# Patient Record
Sex: Male | Born: 1965 | Race: White | Hispanic: No | Marital: Single | Smoking: Never smoker
Health system: Southern US, Community
[De-identification: ages and names within clinical notes are randomized; demographics above are authoritative.]

## PROBLEM LIST (undated history)

## (undated) DIAGNOSIS — N19 Unspecified kidney failure: Secondary | ICD-10-CM

## (undated) HISTORY — PX: LEG SURGERY: SHX1003

## (undated) HISTORY — PX: OTHER SURGICAL HISTORY: SHX169

---

## 2017-12-25 ENCOUNTER — Other Ambulatory Visit: Payer: Self-pay

## 2017-12-25 ENCOUNTER — Encounter (HOSPITAL_BASED_OUTPATIENT_CLINIC_OR_DEPARTMENT_OTHER): Payer: Self-pay

## 2017-12-25 ENCOUNTER — Emergency Department (HOSPITAL_BASED_OUTPATIENT_CLINIC_OR_DEPARTMENT_OTHER)
Admission: EM | Admit: 2017-12-25 | Discharge: 2017-12-25 | Disposition: A | Discharge status: Home / Self Care | Payer: Self-pay | Attending: Emergency Medicine | Admitting: Emergency Medicine

## 2017-12-25 ENCOUNTER — Emergency Department (HOSPITAL_BASED_OUTPATIENT_CLINIC_OR_DEPARTMENT_OTHER): Payer: Self-pay

## 2017-12-25 DIAGNOSIS — K429 Umbilical hernia without obstruction or gangrene: Secondary | ICD-10-CM | POA: Insufficient documentation

## 2017-12-25 HISTORY — DX: Unspecified kidney failure: N19

## 2017-12-25 LAB — CBC WITH DIFFERENTIAL/PLATELET
Abs Immature Granulocytes: 0.03 10*3/uL (ref 0.00–0.07)
Basophils Absolute: 0.1 10*3/uL (ref 0.0–0.1)
Basophils Relative: 1 %
EOS PCT: 3 %
Eosinophils Absolute: 0.2 10*3/uL (ref 0.0–0.5)
HCT: 43.4 % (ref 39.0–52.0)
Hemoglobin: 13.9 g/dL (ref 13.0–17.0)
Immature Granulocytes: 0 %
Lymphocytes Relative: 22 %
Lymphs Abs: 1.7 10*3/uL (ref 0.7–4.0)
MCH: 27.9 pg (ref 26.0–34.0)
MCHC: 32 g/dL (ref 30.0–36.0)
MCV: 87.1 fL (ref 80.0–100.0)
MONOS PCT: 8 %
Monocytes Absolute: 0.6 10*3/uL (ref 0.1–1.0)
Neutro Abs: 5 10*3/uL (ref 1.7–7.7)
Neutrophils Relative %: 66 %
Platelets: 262 10*3/uL (ref 150–400)
RBC: 4.98 MIL/uL (ref 4.22–5.81)
RDW: 12.6 % (ref 11.5–15.5)
WBC: 7.6 10*3/uL (ref 4.0–10.5)
nRBC: 0 % (ref 0.0–0.2)

## 2017-12-25 LAB — COMPREHENSIVE METABOLIC PANEL
ALT: 26 U/L (ref 0–44)
AST: 28 U/L (ref 15–41)
Albumin: 4.1 g/dL (ref 3.5–5.0)
Alkaline Phosphatase: 83 U/L (ref 38–126)
Anion gap: 6 (ref 5–15)
BILIRUBIN TOTAL: 0.2 mg/dL — AB (ref 0.3–1.2)
BUN: 12 mg/dL (ref 6–20)
CO2: 27 mmol/L (ref 22–32)
CREATININE: 0.91 mg/dL (ref 0.61–1.24)
Calcium: 9 mg/dL (ref 8.9–10.3)
Chloride: 104 mmol/L (ref 98–111)
GFR calc Af Amer: >60 mL/min (ref >60)
GFR calc non Af Amer: >60 mL/min (ref >60)
Glucose, Bld: 78 mg/dL (ref 70–99)
Potassium: 3.9 mmol/L (ref 3.5–5.1)
Sodium: 137 mmol/L (ref 135–145)
TOTAL PROTEIN: 7.3 g/dL (ref 6.5–8.1)

## 2017-12-25 LAB — URINALYSIS, ROUTINE W REFLEX MICROSCOPIC
Bilirubin Urine: NEGATIVE
GLUCOSE, UA: NEGATIVE mg/dL
Hgb urine dipstick: NEGATIVE
Ketones, ur: NEGATIVE mg/dL
Leukocytes, UA: NEGATIVE
Nitrite: NEGATIVE
PROTEIN: NEGATIVE mg/dL
Specific Gravity, Urine: 1.02 (ref 1.005–1.030)
pH: 5.5 (ref 5.0–8.0)

## 2017-12-25 LAB — LIPASE, BLOOD: Lipase: 32 U/L (ref 11–51)

## 2017-12-25 MED ORDER — HYDROCODONE-ACETAMINOPHEN 5-325 MG PO TABS: 1.0000 | ORAL_TABLET | Freq: Four times a day (QID) | ORAL | 0 refills | Status: AC | PRN

## 2017-12-25 MED ORDER — IOPAMIDOL (ISOVUE-300) INJECTION 61%
100.0000 mL | Freq: Once | INTRAVENOUS | Status: AC | PRN
  Administered 2017-12-25: 100 mL via INTRAVENOUS

## 2017-12-25 MED ORDER — SODIUM CHLORIDE 0.9 % IV BOLUS
500.0000 mL | Freq: Once | INTRAVENOUS | Status: AC
  Administered 2017-12-25: 500 mL via INTRAVENOUS

## 2017-12-25 NOTE — Discharge Instructions (Addendum)
You have been diagnosed today with umbilical hernia.  At this time there does not appear to be the presence of an emergent medical condition, however there is always the potential for conditions to change. Please read and follow the below instructions.  Please return to the Emergency Department immediately for any new or worsening symptoms. Please be sure to follow up with your Primary Care Provider next week regarding your visit today; please call their office to schedule an appointment even if you are feeling better for a follow-up visit. Please call [facility], Dr. Ardine EngGerkin's office tomorrow morning to schedule an appointment for this week.  You will need to be scheduled for surgery for your umbilical hernia. Additionally your CT scan showed many incidental findings today be sure to discuss this with your primary care provider at your visit this week.  These include: Diverticulosis, fatty liver, aortic atherosclerosis, L5 pars defect with anterior slippage and disc space narrowing, esophageal thickening/distention, enlarged prostate, 4 mm right middle lobe nodule, 4 mm left lower lobe nodule, 2 mm posterior left lower lobe nodule.  Please be sure to review the entirety your scan today with your primary care provider as further evaluation, treatment and imaging may be necessary. You may find your results on your MyChart account. In relation to your hernia today, please use ice packs to help with your pain.  Avoid lifting or exercise as to not worsen your hernia.  You may use the pain medication Norco as prescribed for severe pain breakthroughs.  Do not drive or operate heavy machinery while taking Norco.  Get help right away if: You develop sudden, severe pain near the area of your hernia. You have pain as well as nausea or vomiting. You have pain and the skin over your hernia changes color. You develop a fever. You cannot pass gas You stop having bowel movements  Please read the  additional information packets attached to your discharge summary.  Do not take your medicine if  develop an itchy rash, swelling in your mouth or lips, or difficulty breathing.

## 2017-12-25 NOTE — ED Provider Notes (Signed)
[facility] EMERGENCY DEPARTMENT Provider Note   CSN: [id number] Arrival date & time: 12/25/17  1241     History   Chief Complaint Chief Complaint  Patient presents with  . Abdominal Pain    HPI Donald Green is a 52 y.o. male presented today for abdominal pain.  Patient states that he was diagnosed with umbilical hernia over a decade ago by his primary care provider and that this is not caused him pain since that time.  Patient states that 6 days ago he developed increased swelling to the area accompanied with pain.  Patient states that he has a moderate in intensity aching pain to his umbilicus and suprapubic area that is worsened with movement, eating and improved with rest.  Patient states the pain is constantly there however waxes and wanes.  Additionally patient states that his abdomen has been more distended over the past 6 days.  Patient denies nausea/vomiting, states his last bowel movement was this morning and was normal, states that he still passing gas.  Patient states that he has been otherwise feeling well and is without additional complaint.  HPI  Past Medical History:  Diagnosis Date  . Kidney failure     There are no active problems to display for this patient.   Past Surgical History:  Procedure Laterality Date  . arm surgery    . LEG SURGERY          Home Medications    Prior to Admission medications   Medication Sig Start Date End Date Taking? Authorizing Provider  HYDROcodone-acetaminophen (NORCO/VICODIN) 5-325 MG tablet Take 1-2 tablets by mouth every 6 (six) hours as needed. 12/25/17   Bill Salinas, PA-C    Family History No family history on file.  Social History Social History   Tobacco Use  . Smoking status: Never Smoker  . Smokeless tobacco: Never Used  Substance Use Topics  . Alcohol use: Yes    Comment: occ  . Drug use: Never     Allergies   Vancomycin   Review of Systems Review of Systems    Constitutional: Negative.  Negative for chills and fever.  HENT: Negative.  Negative for rhinorrhea and sore throat.   Eyes: Negative.  Negative for visual disturbance.  Respiratory: Negative.  Negative for cough and shortness of breath.   Cardiovascular: Negative.  Negative for chest pain.  Gastrointestinal: Positive for abdominal distention and abdominal pain. Negative for diarrhea, nausea and vomiting.  Genitourinary: Negative.  Negative for dysuria and hematuria.  Musculoskeletal: Negative.  Negative for arthralgias and myalgias.  Skin: Negative.  Negative for rash.  Neurological: Negative.  Negative for dizziness, weakness and headaches.   Physical Exam Updated Vital Signs BP 131/69   Pulse 61   Temp 98 F (36.7 C) (Oral)   Resp 20   Ht 6' (1.829 m)   Wt 124.7 kg   SpO2 99%   BMI 37.30 kg/m   Physical Exam  Constitutional: He appears well-developed and well-nourished. No distress.  HENT:  Head: Normocephalic and atraumatic.  Right Ear: External ear normal.  Left Ear: External ear normal.  Nose: Nose normal.  Eyes: Pupils are equal, round, and reactive to light. EOM are normal.  Neck: Trachea normal, normal range of motion, full passive range of motion without pain and phonation normal. Neck supple. No tracheal deviation present.  Cardiovascular: Normal rate, regular rhythm and normal heart sounds.  Pulses:      Dorsalis pedis pulses are 2+ on the right  side, and 2+ on the left side.       Posterior tibial pulses are 2+ on the right side, and 2+ on the left side.  Pulmonary/Chest: Effort normal and breath sounds normal. No respiratory distress. He exhibits no tenderness, no crepitus and no deformity.  Abdominal: Soft. He exhibits distension. There is tenderness in the periumbilical area and suprapubic area. There is no rigidity, no rebound, no guarding and no CVA tenderness. A hernia is present.    Patient with nonreducible umbilical hernia.  Genitourinary:   Genitourinary Comments: Deferred by patient  Musculoskeletal: Normal range of motion.  Patient moving all extremity spontaneously and without distress.   Left lower leg swelling greater than right, patient and his wife states that this is a chronic issue due to trauma and surgery to the left lower leg.  Feet:  Right Foot:  Protective Sensation: 3 sites tested. 3 sites sensed.  Left Foot:  Protective Sensation: 3 sites tested. 3 sites sensed.  Neurological: He is alert. GCS eye subscore is 4. GCS verbal subscore is 5. GCS motor subscore is 6.  Speech is clear and goal oriented, follows commands Major Cranial nerves without deficit, no facial droop Normal strength in upper and lower extremities bilaterally including dorsiflexion and plantar flexion, strong and equal grip strength Sensation normal to light touch Moves extremities without ataxia, coordination intact Normal gait  Skin: Skin is warm and dry.  Psychiatric: He has a normal mood and affect. His behavior is normal.   ED Treatments / Results  Labs (all labs ordered are listed, but only abnormal results are displayed) Labs Reviewed  COMPREHENSIVE METABOLIC PANEL - Abnormal; Notable for the following components:      Result Value   Total Bilirubin 0.2 (*)    All other components within normal limits  CBC WITH DIFFERENTIAL/PLATELET  LIPASE, BLOOD  URINALYSIS, ROUTINE W REFLEX MICROSCOPIC    EKG None  Radiology Ct Abdomen Pelvis W Contrast  Result Date: 12/25/2017 CLINICAL DATA:  52 year old male with umbilical pain for the past week. Bulge in area for the past 10 years. Initial encounter. EXAM: CT ABDOMEN AND PELVIS WITH CONTRAST TECHNIQUE: Multidetector CT imaging of the abdomen and pelvis was performed using the standard protocol following bolus administration of intravenous contrast. CONTRAST:  100mL ISOVUE-300 IOPAMIDOL (ISOVUE-300) INJECTION 61% COMPARISON:  None. FINDINGS: Lower chest: 4 mm nodule right middle lobe  (series 4, image 4) left lower lobe 4 mm nodule (series 4, image 16). Posterior left lower lobe 2 mm nodule (series 6, image 116). Scattered areas of subsegmental atelectasis/scarring. Heart size within normal limits. Hepatobiliary: Enlarged fatty liver spanning over 20 cm. Partially contracted gallbladder without calcified gallstones or CT evidence of inflammation. Pancreas: No worrisome pancreatic mass or inflammation. Spleen: No splenic mass or enlargement. Adrenals/Urinary Tract: No obstructing stone or hydronephrosis. No worrisome renal or adrenal lesion. Noncontrast filled views of the urinary bladder unremarkable. Stomach/Bowel: Diverticulosis most notable sigmoid colon with there is associated muscular hypertrophy. No primary bowel inflammatory process identified. Incomplete distension distal esophagus with mild circumferential thickening. Evaluation limited. No gastric abnormality identified. Vascular/Lymphatic: Trace calcified plaque aorta and femoral arteries. No abdominal aortic aneurysm or large vessel occlusion. Replaced hepatic artery. Scattered normal size lymph nodes. Reproductive: Minimally prominent size prostate gland. Other: Fat and vessel containing periumbilical hernia (along superior margin of the umbilicus). Rent measures up to 1.2 cm with hernia contents measuring up to 3.5 cm potentially containing fluid. Hernia contents appear inflamed with infiltration of surrounding fat planes.  This inflammation extends to the anterior margin of adjacent bowel however, bowel does not enter the hernia. Musculoskeletal: L5 bilateral pars defects with 3 mm anterior slip L5 and marked L5-S1 disc space narrowing. This contributes to bilateral L5-S1 foraminal narrowing and encroachment upon the exiting L5 nerve roots. Mild L4-5 disc space narrowing. Mild left hip joint degenerative changes. IMPRESSION: 1. Fat and vessel containing periumbilical hernia (along superior margin of the umbilicus). Rent measures up  to 1.2 cm with hernia contents measuring up to 3.5 cm potentially containing fluid. Hernia contents appear inflamed with infiltration of surrounding fat planes. This inflammation extends to the anterior margin of adjacent bowel however, bowel does not enter the hernia. 2. Sigmoid colon and descending colon diverticulosis with muscular hypertrophy most notable sigmoid colon level. 3. Enlarged fatty liver spanning over 20 cm. 4. Trace calcified plaque aorta and femoral arteries. 5. L5 bilateral pars defects with 3 mm anterior slip L5 and marked L5-S1 disc space narrowing. This contributes to bilateral L5-S1 foraminal narrowing and encroachment upon the exiting L5 nerve roots. 6. Incomplete distension distal esophagus with mild circumferential thickening. Evaluation limited. 7. Minimally prominent size prostate gland. 8. 4 mm nodule right middle lobe (series 4, image 4) left lower lobe 4 mm nodule (series 4, image 16). Posterior left lower lobe 2 mm nodule (series 6, image 116). No follow-up needed if patient is low-risk. Non-contrast chest CT can be considered in 12 months if patient is high-risk. This recommendation follows the consensus statement: Guidelines for Management of Incidental Pulmonary Nodules Detected on CT Images: From the Fleischner Society 2017; Radiology 2017; 284:228-243. Electronically Signed   By: Lacy Duverney M.D.   On: 12/25/2017 16:51    Procedures Procedures (including critical care time)  Medications Ordered in ED Medications  sodium chloride 0.9 % bolus 500 mL ( Intravenous Stopped 12/25/17 1608)  iopamidol (ISOVUE-300) 61 % injection 100 mL (100 mLs Intravenous Contrast Given 12/25/17 1610)     Initial Impression / Assessment and Plan / ED Course  I have reviewed the triage vital signs and the nursing notes.  Pertinent labs & imaging results that were available during my care of the patient were reviewed by me and considered in my medical decision making (see chart for  details).    52 year old male presenting today for 1 week of periumbilical and suprapubic abdominal pain with umbilical hernia present. ---------------------------------------------------------------------------- 5:00PM: CBC within normal limits, lipase within normal limits, urinalysis within normal limits, CMP nonacute. CT abdomen pelvis with multiple findings, discussed with Dr. Rush Landmark.  Consult to general surgery called for inflamed fat/vessel containing hernia.  Remainder of CT findings to be followed up with by primary care provider. ------------------------------------------------------------------------------ 5:24 PM: Consult called to general surgery, Dr. Gerrit Friends, advises symptomatic control, ice packs and follow-up in his clinic this week to schedule surgery. ----------------------------------------------------------------------------- I have discussed CT imaging results with the patient at length today, I have also printed off the CT report for the patient to bring to his primary care provider for follow-up on his multiple incidental findings today.  Patient was informed to call Dr. Ardine Eng office tomorrow morning to schedule an appointment.  Advised on recommendations of ice packs and lifting restrictions.  Patient given small course of Norco for severe pain breakthroughs, informed not to drive or operate heavy machinery while taking these.  At discharge patient is afebrile, not tachycardic, not hypotensive, well-appearing and in no acute distress.  Patient does not meet SIRS/sepsis criteria.  Patient has not required/requested pain  medication during his visit today.  Patient's fianc is here to drive him home.  At this time there does not appear to be any evidence of an acute emergency medical condition and the patient appears stable for discharge with appropriate outpatient follow up. Diagnosis was discussed with patient who verbalizes understanding of care plan and is agreeable to  discharge. I have discussed return precautions with patient who verbalizes understanding of return precautions. Patient strongly encouraged to follow-up with their PCP within next week and General Surgery tomorrow. All questions answered.  Patient's case discussed with Dr. Rush Landmark who agrees with plan to discharge with follow-up.   Note: Portions of this report may have been transcribed using voice recognition software. Every effort was made to ensure accuracy; however, inadvertent computerized transcription errors may still be present. Final Clinical Impressions(s) / ED Diagnoses   Final diagnoses:  Umbilical hernia without obstruction and without gangrene    ED Discharge Orders         Ordered    HYDROcodone-acetaminophen (NORCO/VICODIN) 5-325 MG tablet  Every 6 hours PRN     12/25/17 1745           Bill Salinas, PA-C 12/26/17 0023    Tegeler, Canary Brim, MD 12/26/17 1136

## 2017-12-25 NOTE — ED Triage Notes (Signed)
C/o lower abd pain x 1 week-"bulge" to the area x 10 yrs-no hernia dx-NAD-steady gait

## 2019-03-13 ENCOUNTER — Other Ambulatory Visit (HOSPITAL_BASED_OUTPATIENT_CLINIC_OR_DEPARTMENT_OTHER): Payer: Self-pay | Admitting: Family Medicine

## 2019-03-13 DIAGNOSIS — R911 Solitary pulmonary nodule: Secondary | ICD-10-CM

## 2019-03-18 ENCOUNTER — Other Ambulatory Visit: Payer: Self-pay

## 2019-03-18 ENCOUNTER — Ambulatory Visit (HOSPITAL_BASED_OUTPATIENT_CLINIC_OR_DEPARTMENT_OTHER)
Admission: RE | Admit: 2019-03-18 | Discharge: 2019-03-18 | Disposition: A | Discharge status: Home / Self Care | Payer: 59 | Source: Ambulatory Visit | Attending: Family Medicine | Admitting: Family Medicine

## 2019-03-18 DIAGNOSIS — R911 Solitary pulmonary nodule: Secondary | ICD-10-CM | POA: Insufficient documentation

## 2019-05-26 IMAGING — CT CT ABD-PELV W/ CM
2 of 5 series · 14 of 46 positions shown, 16 images · IV contrast (APPLIED)
Comparison: None.

CLINICAL DATA: 52-year-old male with umbilical pain for the past
week. Bulge in area for the past 10 years. Initial encounter.

EXAM:
CT ABDOMEN AND PELVIS WITH CONTRAST
TECHNIQUE: Multidetector CT imaging of the abdomen and pelvis was performed
using the standard protocol following bolus administration of
intravenous contrast.
CONTRAST:  100mL LWYCQL-XQQ IOPAMIDOL (LWYCQL-XQQ) INJECTION 61%

[Series 2: axial st · axial · 0.98mm/px · z∈[-556,-46]mm · 11 of 116 slices shown, 13 images]
[im 7/116  soft-tissue]
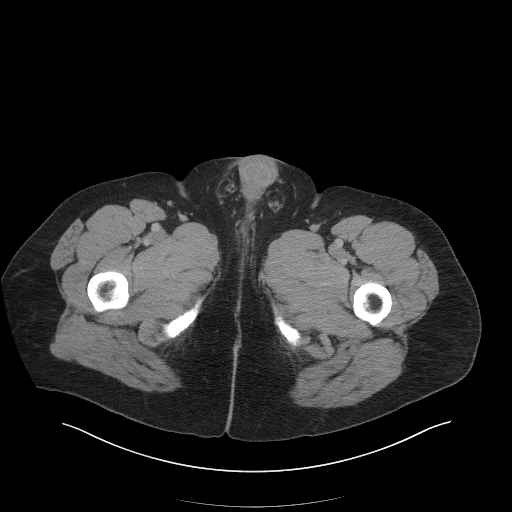
[im 7/116  bone]
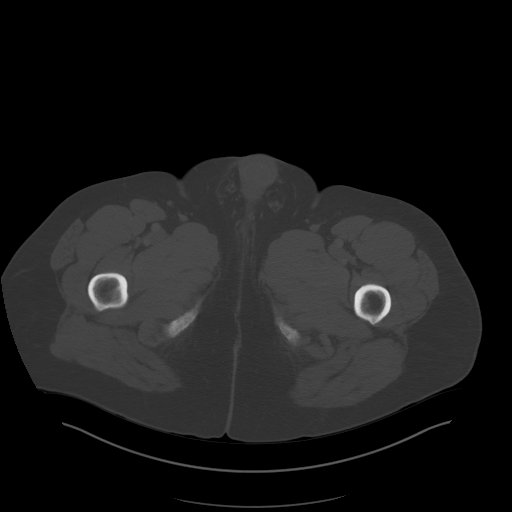
[im 20/116  soft-tissue]
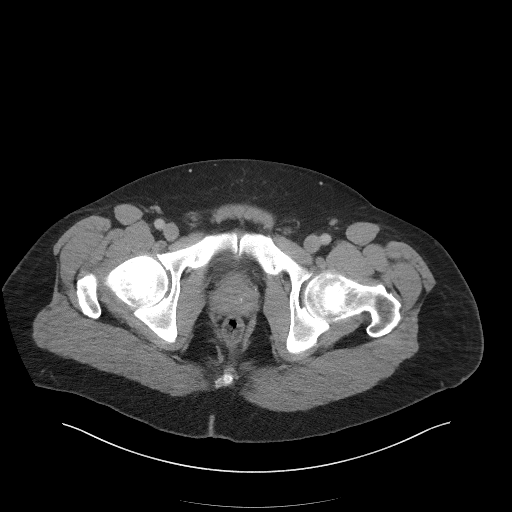
[im 26/116  soft-tissue]
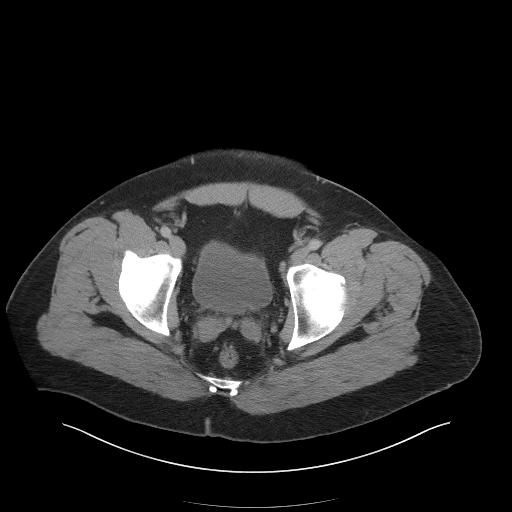
[im 39/116  soft-tissue]
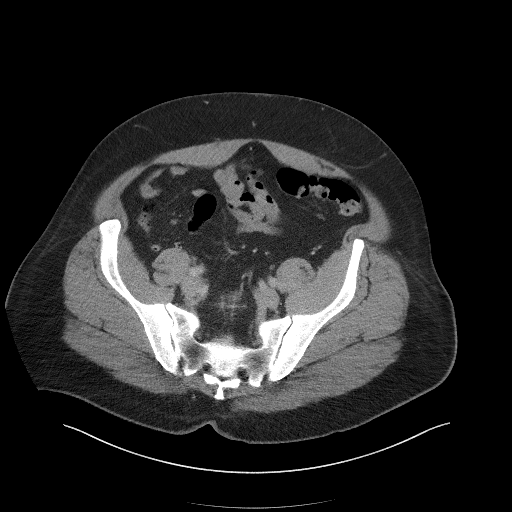
[im 45/116  soft-tissue]
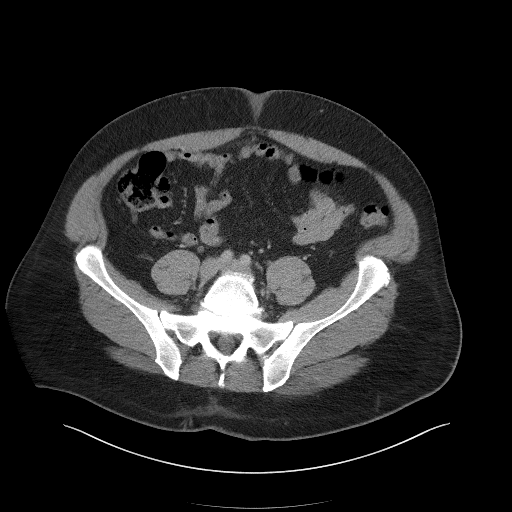
[im 58/116  soft-tissue]
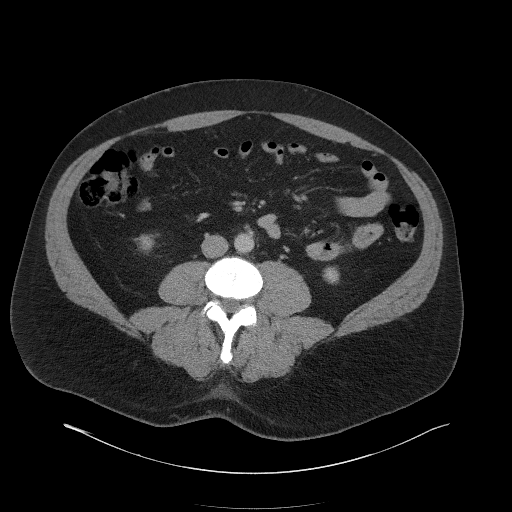
[im 71/116  soft-tissue]
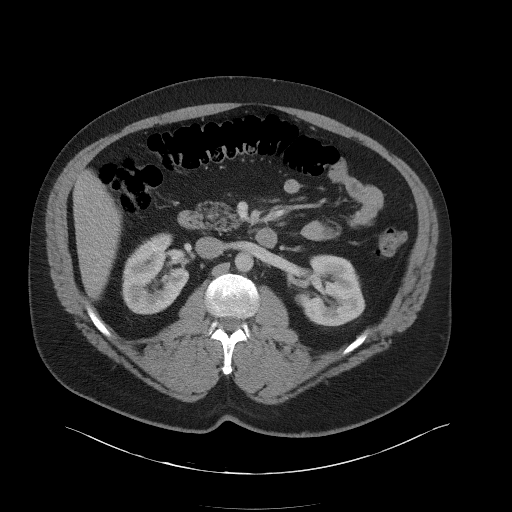
[im 77/116  soft-tissue]
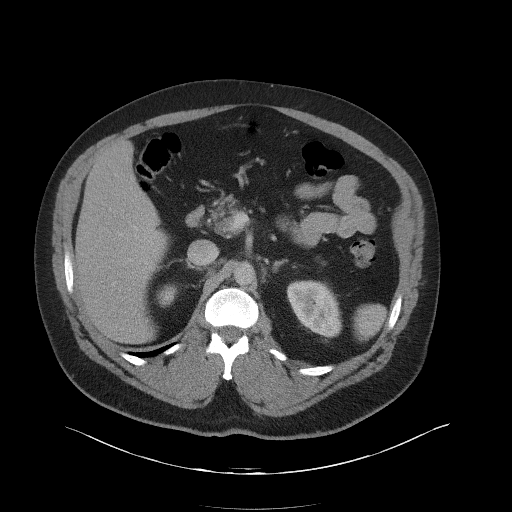
[im 90/116  soft-tissue]
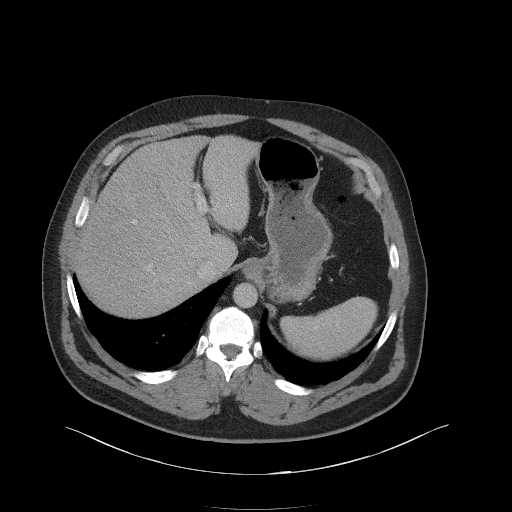
[im 90/116  bone]
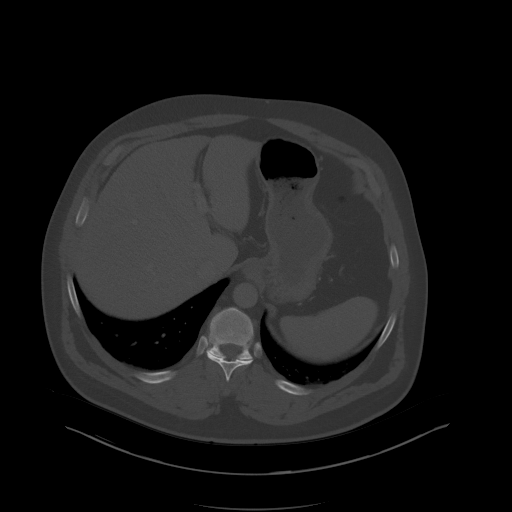
[im 96/116  soft-tissue]
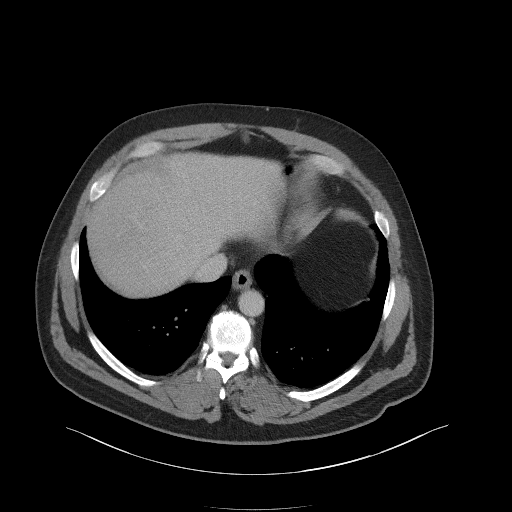
[im 109/116  soft-tissue]
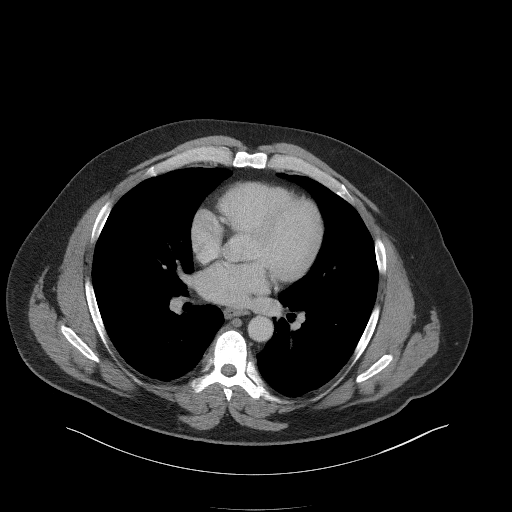

[Series 5: coronal st · coronal · 0.91mm/px · 3 of 124 slices shown]
[im 42/124  soft-tissue]
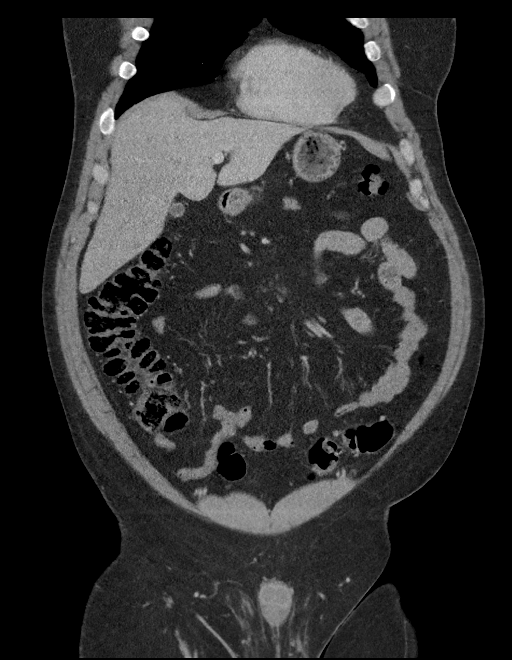
[im 55/124  soft-tissue]
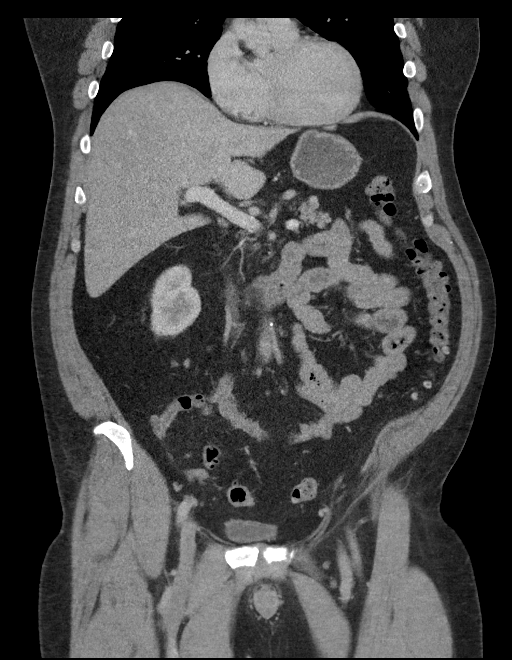
[im 69/124  soft-tissue]
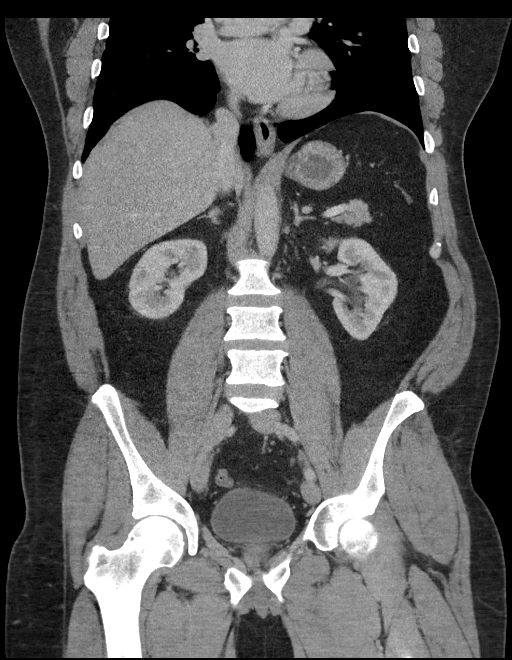

[14 of 46 positions shown; findings below may reference images not displayed]

FINDINGS: Lower chest: 4 mm nodule right middle lobe (series 4, image 4) left
lower lobe 4 mm nodule (series 4, image 16). Posterior left lower
lobe 2 mm nodule (series 6, image 116). Scattered areas of
subsegmental atelectasis/scarring. Heart size within normal limits.

Hepatobiliary: Enlarged fatty liver spanning over 20 cm. Partially
contracted gallbladder without calcified gallstones or CT evidence
of inflammation.

Pancreas: No worrisome pancreatic mass or inflammation.

Spleen: No splenic mass or enlargement.

Adrenals/Urinary Tract: No obstructing stone or hydronephrosis. No
worrisome renal or adrenal lesion. Noncontrast filled views of the
urinary bladder unremarkable.

Stomach/Bowel: Diverticulosis most notable sigmoid colon with there
is associated muscular hypertrophy. No primary bowel inflammatory
process identified.

Incomplete distension distal esophagus with mild circumferential
thickening. Evaluation limited. No gastric abnormality identified.

Vascular/Lymphatic: Trace calcified plaque aorta and femoral
arteries. No abdominal aortic aneurysm or large vessel occlusion.
Replaced hepatic artery.

Scattered normal size lymph nodes.

Reproductive: Minimally prominent size prostate gland.

Other: Fat and vessel containing periumbilical hernia (along
superior margin of the umbilicus). Rent measures up to 1.2 cm with
hernia contents measuring up to 3.5 cm potentially containing fluid.
Hernia contents appear inflamed with infiltration of surrounding fat
planes. This inflammation extends to the anterior margin of adjacent
bowel however, bowel does not enter the hernia.

Musculoskeletal: L5 bilateral pars defects with 3 mm anterior slip
L5 and marked L5-S1 disc space narrowing. This contributes to
bilateral L5-S1 foraminal narrowing and encroachment upon the
exiting L5 nerve roots. Mild L4-5 disc space narrowing. Mild left
hip joint degenerative changes.
IMPRESSION: 1. Fat and vessel containing periumbilical hernia (along superior
margin of the umbilicus). Rent measures up to 1.2 cm with hernia
contents measuring up to 3.5 cm potentially containing fluid. Hernia
contents appear inflamed with infiltration of surrounding fat
planes. This inflammation extends to the anterior margin of adjacent
bowel however, bowel does not enter the hernia.
2. Sigmoid colon and descending colon diverticulosis with muscular
hypertrophy most notable sigmoid colon level.
3. Enlarged fatty liver spanning over 20 cm.
4. Trace calcified plaque aorta and femoral arteries.
5. L5 bilateral pars defects with 3 mm anterior slip L5 and marked
L5-S1 disc space narrowing. This contributes to bilateral L5-S1
foraminal narrowing and encroachment upon the exiting L5 nerve
roots.
6. Incomplete distension distal esophagus with mild circumferential
thickening. Evaluation limited.
7. Minimally prominent size prostate gland.
8. 4 mm nodule right middle lobe (series 4, image 4) left lower lobe
4 mm nodule (series 4, image 16). Posterior left lower lobe 2 mm
nodule (series 6, image 116). No follow-up needed if patient is
low-risk. Non-contrast chest CT can be considered in 12 months if
patient is high-risk. This recommendation follows the consensus
statement: Guidelines for Management of Incidental Pulmonary Nodules
Detected on CT Images: From the [HOSPITAL] 0267; Radiology

## 2020-08-16 IMAGING — CT CT CHEST W/O CM
2 of 3 series · 15 of 36 positions shown, 18 images · non-contrast
Comparison: Abdomen CT on 12/25/2017; no prior chest CT

CLINICAL DATA: Follow-up indeterminate pulmonary nodules.

EXAM:
CT CHEST WITHOUT CONTRAST
TECHNIQUE: Multidetector CT imaging of the chest was performed following the
standard protocol without IV contrast.

[Series 2: thorax · axial · 0.81mm/px · z∈[-350,-72]mm · 12 of 165 slices shown, 15 images]
[im 13/165  mediastinal]
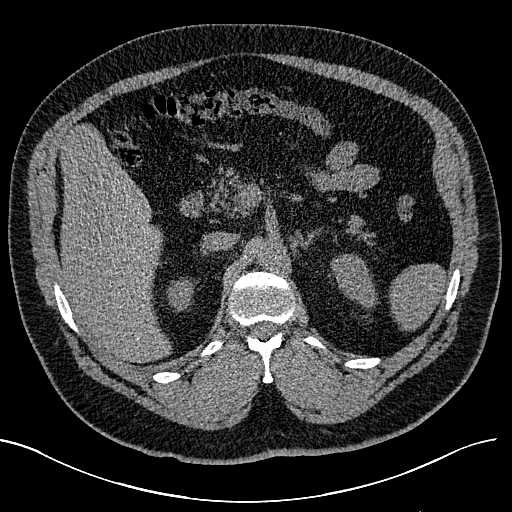
[im 13/165  lung]
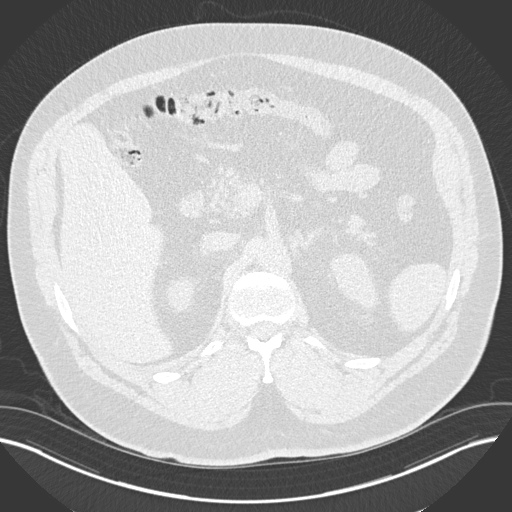
[im 25/165  lung]
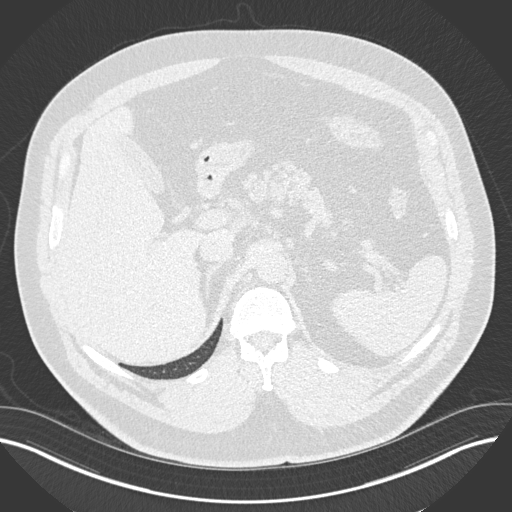
[im 37/165  lung]
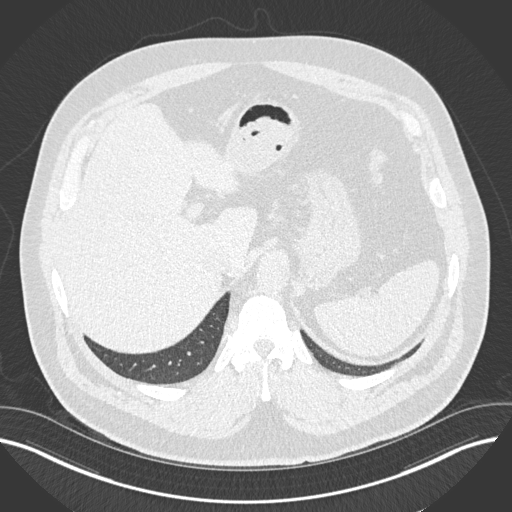
[im 49/165  lung]
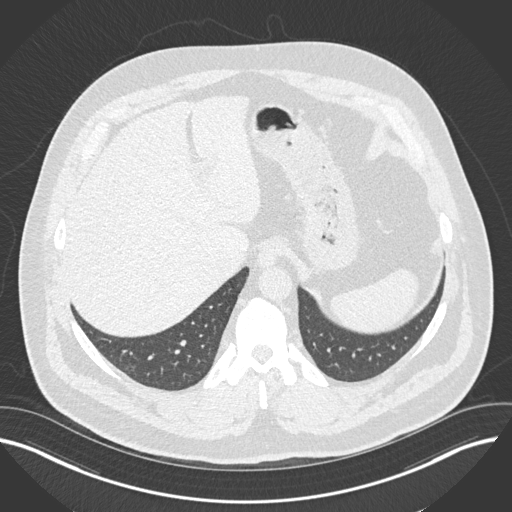
[im 61/165  mediastinal]
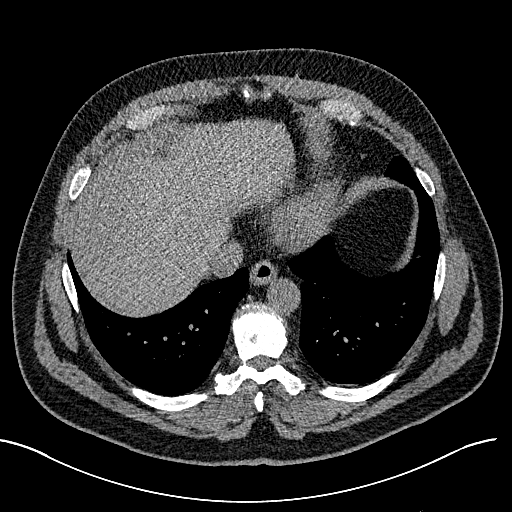
[im 61/165  lung]
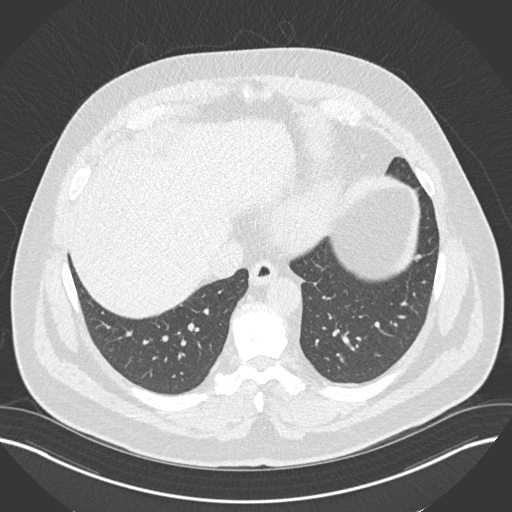
[im 73/165  lung]
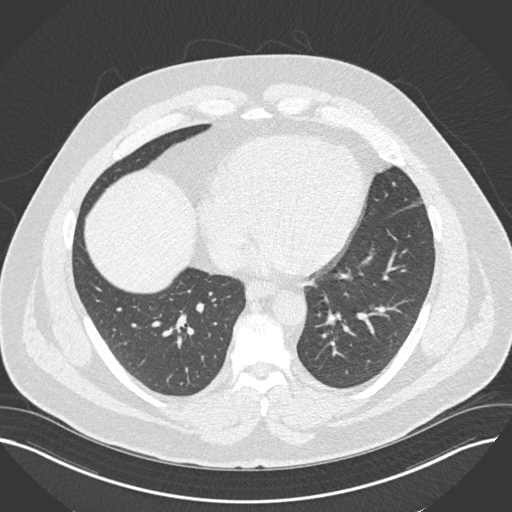
[im 92/165  lung]
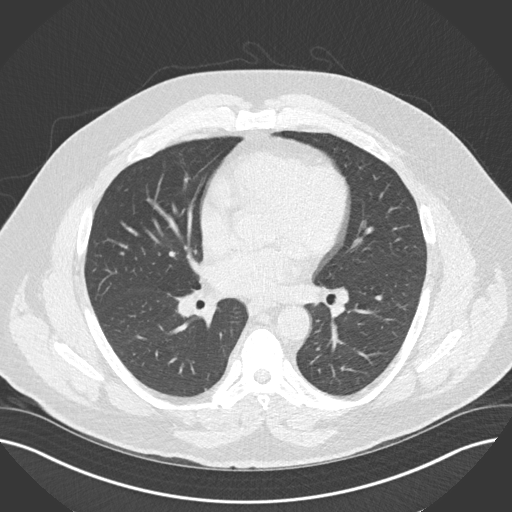
[im 104/165  lung]
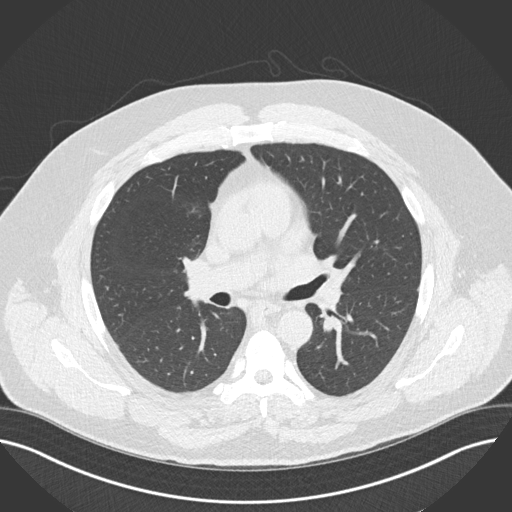
[im 116/165  mediastinal]
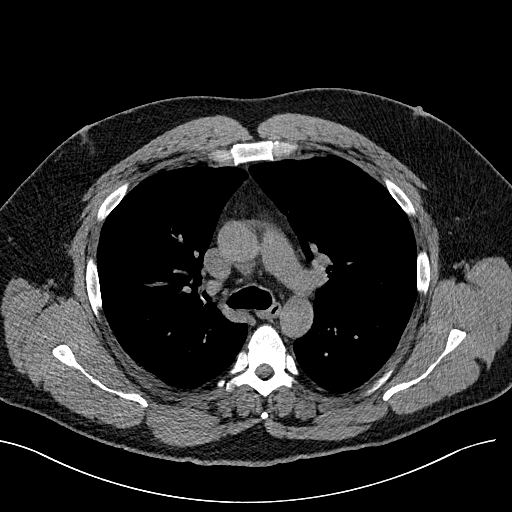
[im 116/165  lung]
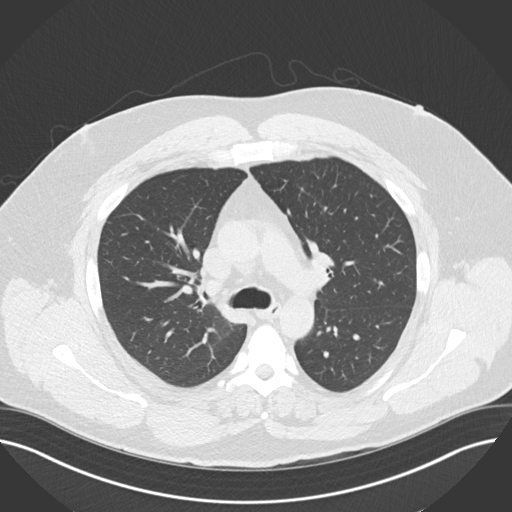
[im 128/165  lung]
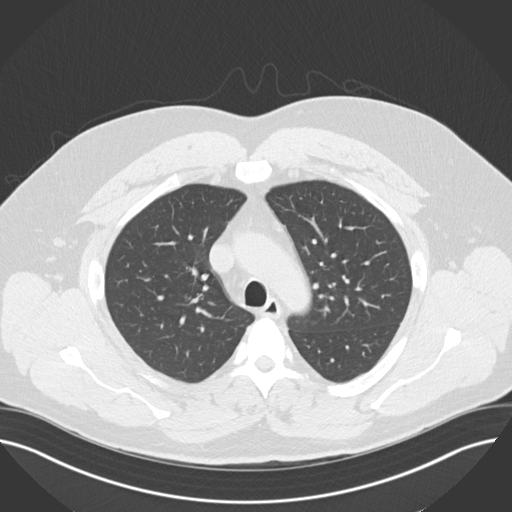
[im 140/165  lung]
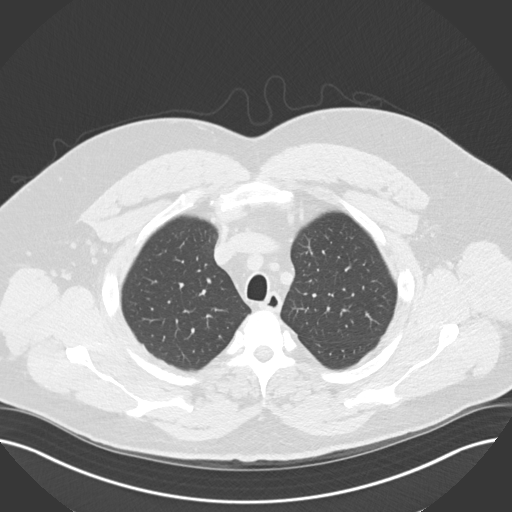
[im 152/165  lung]
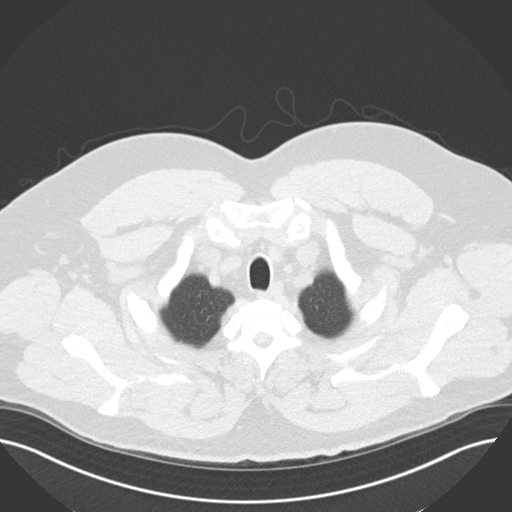

[Series 5: coronal · coronal · 0.67mm/px · 3 of 173 slices shown]
[im 35/173  lung]
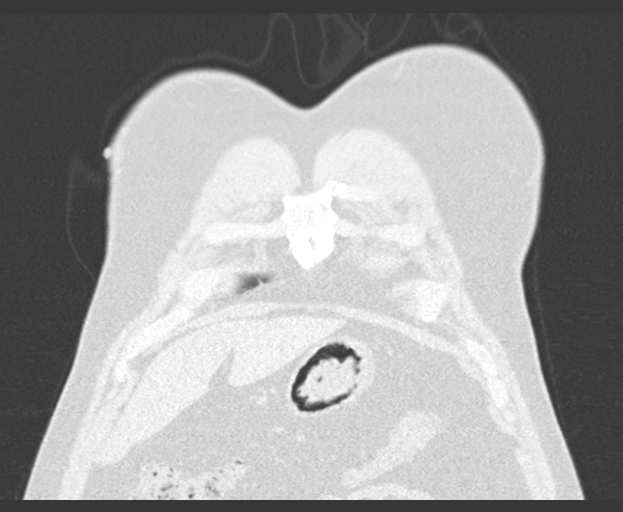
[im 69/173  lung]
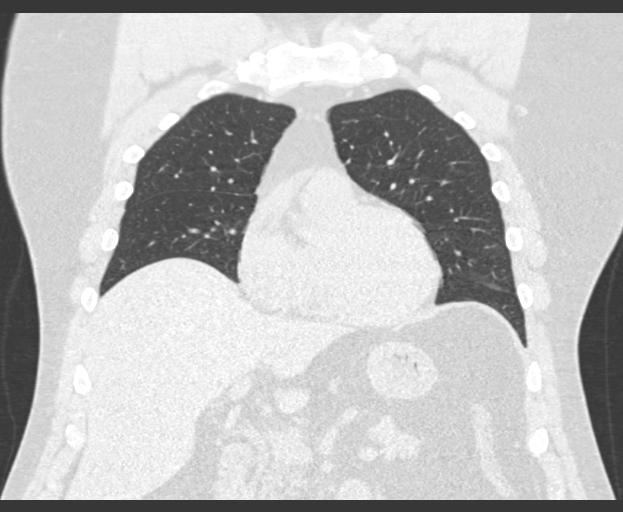
[im 104/173  lung]
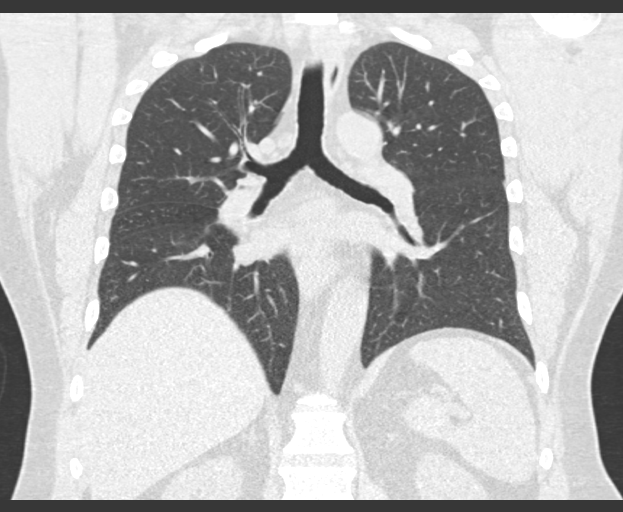

[15 of 36 positions shown; findings below may reference images not displayed]

FINDINGS: Cardiovascular:  No acute findings.

Mediastinum/Nodes: No masses or pathologically enlarged lymph nodes
identified on this unenhanced exam.

Lungs/Pleura: A few tiny less than 5 mm pulmonary nodules are seen
bilaterally which are stable since previous study. No suspicious
pulmonary nodules or masses identified. No evidence of pulmonary
infiltrate or pleural effusion.

Upper Abdomen:  Unremarkable.

Musculoskeletal:  No suspicious bone lesions.
IMPRESSION: Several tiny less than 5 mm bilateral pulmonary nodules remain
stable, consistent with benign etiology. No further imaging
follow-up required. This recommendation follows the consensus
statement: Guidelines for Management of Small Pulmonary Nodules
Detected on CT Images: From the [HOSPITAL] 4811; Radiology

## 2023-09-14 ENCOUNTER — Ambulatory Visit: Payer: Self-pay | Attending: Cardiology | Admitting: Cardiology
